# Patient Record
Sex: Female | Born: 2000 | Race: White | Hispanic: No | Marital: Single
Health system: Southern US, Academic
[De-identification: ages and names within clinical notes are randomized; demographics above are authoritative.]

## PROBLEM LIST (undated history)

## (undated) DIAGNOSIS — R519 Headache, unspecified: Secondary | ICD-10-CM

## (undated) DIAGNOSIS — R51 Headache: Secondary | ICD-10-CM

## (undated) HISTORY — PX: TONSILLECTOMY: SUR1361

## (undated) HISTORY — DX: Headache, unspecified: R51.9

## (undated) HISTORY — DX: Headache: R51

## (undated) HISTORY — PX: ADENOIDECTOMY: SUR15

---
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
## 2021-04-28 ENCOUNTER — Encounter (INDEPENDENT_AMBULATORY_CARE_PROVIDER_SITE_OTHER): Payer: Self-pay

## 2022-07-12 ENCOUNTER — Telehealth: Payer: Self-pay | Admitting: Physician Assistant

## 2022-07-12 DIAGNOSIS — T7840XA Allergy, unspecified, initial encounter: Secondary | ICD-10-CM

## 2022-07-12 MED ORDER — TRIAMCINOLONE ACETONIDE 0.1 % EX CREA: 1.0000 | TOPICAL_CREAM | Freq: Two times a day (BID) | CUTANEOUS | 0 refills | Status: DC

## 2022-07-12 MED ORDER — PREDNISONE 10 MG (21) PO TBPK: ORAL_TABLET | ORAL | 0 refills | Status: DC

## 2022-07-12 NOTE — Progress Notes (Signed)
Virtual Visit Consent   Kathryn Wheeler, you are scheduled for a virtual visit with a [facility] provider today. Just as with appointments in the office, your consent must be obtained to participate. Your consent will be active for this visit and any virtual visit you may have with one of our providers in the next 365 days. If you have a MyChart account, a copy of this consent can be sent to you electronically.  As this is a virtual visit, video technology does not allow for your provider to perform a traditional examination. This may limit your provider's ability to fully assess your condition. If your provider identifies any concerns that need to be evaluated in person or the need to arrange testing (such as labs, EKG, etc.), we will make arrangements to do so. Although advances in technology are sophisticated, we cannot ensure that it will always work on either your end or our end. If the connection with a video visit is poor, the visit may have to be switched to a telephone visit. With either a video or telephone visit, we are not always able to ensure that we have a secure connection.  By engaging in this virtual visit, you consent to the provision of healthcare and authorize for your insurance to be billed (if applicable) for the services provided during this visit. Depending on your insurance coverage, you may receive a charge related to this service.  I need to obtain your verbal consent now. Are you willing to proceed with your visit today? Druanne Bosques has provided verbal consent on 07/12/2022 for a virtual visit (video or telephone). Kathryn Wheeler, [place]  Date: 07/12/2022 3:13 PM  Virtual Visit via Video Note   I, Kathryn Wheeler, connected with  Philippa Vessey  ([id number], [birth year]) on 07/12/22 at  3:00 PM EDT by a video-enabled telemedicine application and verified that I am speaking with the correct person using two identifiers.  Location: Patient: Virtual Visit Location  Patient: Home Provider: Virtual Visit Location Provider: Home Office   I discussed the limitations of evaluation and management by telemedicine and the availability of in person appointments. The patient expressed understanding and agreed to proceed.    History of Present Illness: Kathryn Wheeler is a 21 y.o. who identifies as a female who was assigned female at birth, and is being seen today for possible allergic reaction. Patient endorses noting a rash of her neck, bilateral but L > R, about a week ago. Rash is itchy but not painful. Notes heat makes the area worse. Is now spreading up to include her face as well. Denies mouth, lip or tongue swelling. Denies fever, chills or malaise. Denies recent travel or sick contact. Denies rash elsewhere. Has been using some new facial cleansers recently but no other change to soaps, lotions, detergents or other hygiene products. Has used Benadryl OTC with some mild relief.   HPI: HPI  Problems:  Patient Active Problem List   Diagnosis Date Noted   Abnormal weight gain 03/05/2019   Insulin resistance 03/05/2019   Acanthosis 03/05/2019   Alopecia 02/26/2019   Obesity 02/26/2019   Migraine without aura and without status migrainosus, not intractable 11/20/2018   Episodic tension-type headache, not intractable 11/20/2018    Allergies: No Known Allergies Medications:  Current Outpatient Medications:    predniSONE (STERAPRED UNI-PAK 21 TAB) 10 MG (21) TBPK tablet, Take following package directions., Disp: 21 tablet, Rfl: 0   triamcinolone cream (KENALOG) 0.1 %, Apply 1 Application topically 2 (two)  times daily., Disp: 30 g, Rfl: 0  Observations/Objective: Patient is well-developed, well-nourished in no acute distress.  Resting comfortably at home.  Head is normocephalic, atraumatic.  No labored breathing. Speech is clear and coherent with logical content.  Patient is alert and oriented at baseline.  Erythematous rash of neck and L side of face with  areas of confluence. Rash   Assessment and Plan: 1. Allergic reaction, initial encounter - triamcinolone cream (KENALOG) 0.1 %; Apply 1 Application topically 2 (two) times daily.  Dispense: 30 g; Refill: 0 - predniSONE (STERAPRED UNI-PAK 21 TAB) 10 MG (21) TBPK tablet; Take following package directions.  Dispense: 21 tablet; Refill: 0  Supportive measures and OTC medications reviewed. Unknown trigger although suspect her new face wash so will have her stop use of this product and also keep a symptom journal. Start triamcinolone on the neck. Giving spread to face, will add on a steroid taper pack. Follow-up in person if not resolving.   Follow Up Instructions: I discussed the assessment and treatment plan with the patient. The patient was provided an opportunity to ask questions and all were answered. The patient agreed with the plan and demonstrated an understanding of the instructions.  A copy of instructions were sent to the patient via MyChart unless otherwise noted below.   The patient was advised to call back or seek an in-person evaluation if the symptoms worsen or if the condition fails to improve as anticipated.  Time:  I spent 10 minutes with the patient via telehealth technology discussing the above problems/concerns.    Kathryn Climes, PA-C

## 2022-07-12 NOTE — Patient Instructions (Signed)
  Sol Blazing, thank you for joining Piedad Climes, PA-C for today's virtual visit.  While this provider is not your primary care provider (PCP), if your PCP is located in our provider database this encounter information will be shared with them immediately following your visit.  Consent: (Patient) Kathryn Wheeler provided verbal consent for this virtual visit at the beginning of the encounter.  Current Medications:  Current Outpatient Medications:    MIGRELIEF 200-180-50 MG TABS, Take 2 tablets daily (Patient not taking: Reported on 03/05/2019), Disp: , Rfl:    rizatriptan (MAXALT-MLT) 10 MG disintegrating tablet, Take 1 tablet with 400 mg of ibuprofen at onset of migraine, may repeat an additional tablet in 2 hours if needed (Patient not taking: Reported on 03/05/2019), Disp: 9 tablet, Rfl: 5   Medications ordered in this encounter:  No orders of the defined types were placed in this encounter.    *If you need refills on other medications prior to your next appointment, please contact your pharmacy*  Follow-Up: Call back or seek an in-person evaluation if the symptoms worsen or if the condition fails to improve as anticipated.  Other Instructions Please keep the skin clean and dry. Cold compresses may be beneficial. Ok to continue use of the Benadryl. Apply the triamcinolone cream to areas on the neck. Take the steroid pack as directed.  If symptoms are not fully resolving or if you note any new/worsening symptoms despite treatment, please seek an in-person evaluation.    If you have been instructed to have an in-person evaluation today at a local Urgent Care facility, please use the link below. It will take you to a list of all of our available [facility], including address, phone number and hours of operation. Please do not delay care.  [facility]  If you or a family member do not have a primary care provider, use the link below to schedule a  visit and establish care. When you choose a [facility] primary care physician or advanced practice provider, you gain a long-term partner in health. Find a Primary Care Provider  Learn more about [facility]'s in-office and virtual care options: [facility] - Get Care Now

## 2022-07-14 ENCOUNTER — Ambulatory Visit: Payer: Self-pay | Admitting: *Deleted

## 2022-07-14 NOTE — Telephone Encounter (Signed)
  Chief Complaint: rash not getting better after tele health visit on 07/12/2022.   Now around her eyes Symptoms: rash now around her eyes Frequency: Rash around eyes new but rash on neck still there even with steroids Pertinent Negatives: Patient denies N/A Disposition: [] ED /[x] Urgent Care (no appt availability in office) / [] Appointment(In office/virtual)/ []  [facility]/ [] Home Care/ [] Refused Recommended Disposition /[] [facility]/ []  Follow-up with PCP Additional Notes: Offered another tele health visit or urgent care.   Mother decided to go to the urgent care.

## 2022-07-14 NOTE — Telephone Encounter (Signed)
Pt's mother called in but either she hung up or the line disconnected before we were connected. I called her back at [phone].   Reason for Disposition  Localized rash present > 7 days    Going to urgent care  Answer Assessment - Initial Assessment Questions 1. APPEARANCE of RASH: "Describe the rash."      Has red rash around her eyes.   Did tele health visit on Wed.   Medicine that they gave her not helping including the steroids.  The rash seems to be getting worse.   It's still on her neck too. Mother Kathryn Wheeler answered questions but [name] was there with her. 2. LOCATION: "Where is the rash located?"      Now around her eyes and still on her neck. 3. NUMBER: "How many spots are there?"      Not asked 4. SIZE: "How big are the spots?" (Inches, centimeters or compare to size of a coin)      Red rash 5. ONSET: "When did the rash start?"      A week ago 6. ITCHING: "Does the rash itch?" If Yes, ask: "How bad is the itch?"  (Scale 0-10; or none, mild, moderate, severe)     Not asked since been seen for it already but it's not improving with the medications. 7. PAIN: "Does the rash hurt?" If Yes, ask: "How bad is the pain?"  (Scale 0-10; or none, mild, moderate, severe)    - NONE (0): no pain    - MILD (1-3): doesn't interfere with normal activities     - MODERATE (4-7): interferes with normal activities or awakens from sleep     - SEVERE (8-10): excruciating pain, unable to do any normal activities     Not asked 8. OTHER SYMPTOMS: "Do you have any other symptoms?" (e.g., fever)     Not asked 9. PREGNANCY: "Is there any chance you are pregnant?" "When was your last menstrual period?"     Not asked  Protocols used: Rash or Redness - Localized-A-AH

## 2023-11-07 ENCOUNTER — Ambulatory Visit
Admission: EM | Admit: 2023-11-07 | Discharge: 2023-11-07 | Disposition: A | Discharge status: Home / Self Care | Payer: Self-pay | Attending: Internal Medicine | Admitting: Internal Medicine

## 2023-11-07 ENCOUNTER — Ambulatory Visit: Payer: Self-pay

## 2023-11-07 DIAGNOSIS — Z9089 Acquired absence of other organs: Secondary | ICD-10-CM | POA: Insufficient documentation

## 2023-11-07 DIAGNOSIS — Z23 Encounter for immunization: Secondary | ICD-10-CM | POA: Insufficient documentation

## 2023-11-07 DIAGNOSIS — L659 Nonscarring hair loss, unspecified: Secondary | ICD-10-CM | POA: Insufficient documentation

## 2023-11-07 DIAGNOSIS — M79644 Pain in right finger(s): Secondary | ICD-10-CM

## 2023-11-07 DIAGNOSIS — W57XXXA Bitten or stung by nonvenomous insect and other nonvenomous arthropods, initial encounter: Secondary | ICD-10-CM | POA: Insufficient documentation

## 2023-11-07 DIAGNOSIS — Z0000 Encounter for general adult medical examination without abnormal findings: Secondary | ICD-10-CM | POA: Insufficient documentation

## 2023-11-07 DIAGNOSIS — J029 Acute pharyngitis, unspecified: Secondary | ICD-10-CM | POA: Insufficient documentation

## 2023-11-07 DIAGNOSIS — L309 Dermatitis, unspecified: Secondary | ICD-10-CM | POA: Insufficient documentation

## 2023-11-07 DIAGNOSIS — J039 Acute tonsillitis, unspecified: Secondary | ICD-10-CM | POA: Insufficient documentation

## 2023-11-07 DIAGNOSIS — S60021A Contusion of right index finger without damage to nail, initial encounter: Secondary | ICD-10-CM

## 2023-11-07 DIAGNOSIS — R300 Dysuria: Secondary | ICD-10-CM | POA: Insufficient documentation

## 2023-11-07 DIAGNOSIS — H66002 Acute suppurative otitis media without spontaneous rupture of ear drum, left ear: Secondary | ICD-10-CM | POA: Insufficient documentation

## 2023-11-07 DIAGNOSIS — J050 Acute obstructive laryngitis [croup]: Secondary | ICD-10-CM | POA: Insufficient documentation

## 2023-11-07 DIAGNOSIS — M940 Chondrocostal junction syndrome [Tietze]: Secondary | ICD-10-CM | POA: Insufficient documentation

## 2023-11-07 DIAGNOSIS — J301 Allergic rhinitis due to pollen: Secondary | ICD-10-CM | POA: Insufficient documentation

## 2023-11-07 DIAGNOSIS — H6690 Otitis media, unspecified, unspecified ear: Secondary | ICD-10-CM | POA: Insufficient documentation

## 2023-11-07 DIAGNOSIS — L259 Unspecified contact dermatitis, unspecified cause: Secondary | ICD-10-CM | POA: Insufficient documentation

## 2023-11-07 DIAGNOSIS — Z4802 Encounter for removal of sutures: Secondary | ICD-10-CM | POA: Insufficient documentation

## 2023-11-07 DIAGNOSIS — S39011A Strain of muscle, fascia and tendon of abdomen, initial encounter: Secondary | ICD-10-CM | POA: Insufficient documentation

## 2023-11-07 DIAGNOSIS — Z719 Counseling, unspecified: Secondary | ICD-10-CM | POA: Insufficient documentation

## 2023-11-07 DIAGNOSIS — L509 Urticaria, unspecified: Secondary | ICD-10-CM | POA: Insufficient documentation

## 2023-11-07 DIAGNOSIS — R5381 Other malaise: Secondary | ICD-10-CM | POA: Insufficient documentation

## 2023-11-07 DIAGNOSIS — H109 Unspecified conjunctivitis: Secondary | ICD-10-CM | POA: Insufficient documentation

## 2023-11-07 DIAGNOSIS — Z00129 Encounter for routine child health examination without abnormal findings: Secondary | ICD-10-CM | POA: Insufficient documentation

## 2023-11-07 DIAGNOSIS — L0390 Cellulitis, unspecified: Secondary | ICD-10-CM | POA: Insufficient documentation

## 2023-11-07 DIAGNOSIS — Z68.41 Body mass index (BMI) pediatric, 85th percentile to less than 95th percentile for age: Secondary | ICD-10-CM | POA: Insufficient documentation

## 2023-11-07 DIAGNOSIS — L0100 Impetigo, unspecified: Secondary | ICD-10-CM | POA: Insufficient documentation

## 2023-11-07 DIAGNOSIS — R519 Headache, unspecified: Secondary | ICD-10-CM | POA: Insufficient documentation

## 2023-11-07 DIAGNOSIS — J309 Allergic rhinitis, unspecified: Secondary | ICD-10-CM | POA: Insufficient documentation

## 2023-11-07 DIAGNOSIS — R062 Wheezing: Secondary | ICD-10-CM | POA: Insufficient documentation

## 2023-11-07 DIAGNOSIS — J020 Streptococcal pharyngitis: Secondary | ICD-10-CM | POA: Insufficient documentation

## 2023-11-07 DIAGNOSIS — H60399 Other infective otitis externa, unspecified ear: Secondary | ICD-10-CM | POA: Insufficient documentation

## 2023-11-07 DIAGNOSIS — K5900 Constipation, unspecified: Secondary | ICD-10-CM | POA: Insufficient documentation

## 2023-11-07 DIAGNOSIS — J069 Acute upper respiratory infection, unspecified: Secondary | ICD-10-CM | POA: Insufficient documentation

## 2023-11-07 NOTE — ED Triage Notes (Signed)
"  I think I may have fractured my right index finger". "I was playing pickle ball and the paddle wacked me". Pain present, some swelling, decreased rom, "this happened Monday 11-05-2023".

## 2023-11-07 NOTE — Discharge Instructions (Signed)
Keep finger splinted.  Follow up with the Orthopaedist if pain persist past one week  Your final xray report is pending.  If anything is abnormal.  We will call you

## 2023-11-07 NOTE — ED Provider Notes (Signed)
[facility]    CSN: [id number] Arrival date & time: 11/07/23  1402      History   Chief Complaint Chief Complaint  Patient presents with   Finger Injury    HPI Kathryn Wheeler is a 22 y.o. female.   Patient reports that she injured her right index finger while playing pickle ball.  Patient reports finger was hit with a racquet.  Patient reports that she injured the finger on Monday 11/11.   The history is provided by the patient. No language interpreter was used.    Past Medical History:  Diagnosis Date   Headache     Patient Active Problem List   Diagnosis Date Noted   BMI 85th to less than 95th percentile with athletic build, pediatric 11/07/2023   Viral upper respiratory tract infection 11/07/2023   Acute suppurative otitis media of left ear without spontaneous rupture of tympanic membrane 11/07/2023   Allergic rhinitis 11/07/2023   Costochondritis 11/07/2023   Contact dermatitis of female genitalia 11/07/2023   Routine check-up 11/07/2023   Strain of abdominal muscle 11/07/2023   Acute nonintractable headache 11/07/2023   Cellulitis 11/07/2023   Conjunctivitis 11/07/2023   Constipation 11/07/2023   Counseled by nurse 11/07/2023   Dysuria 11/07/2023   Eczema 11/07/2023   Encounter for removal of sutures 11/07/2023   Hair loss 11/07/2023   Healthy child on routine physical examination 11/07/2023   History of adenoidectomy 11/07/2023   Impetigo 11/07/2023   Infective otitis externa 11/07/2023   Influenza vaccine administered 11/07/2023   Insect bites 11/07/2023   Malaise and fatigue 11/07/2023   Need for immunization against influenza 11/07/2023   Seasonal allergic rhinitis due to pollen 11/07/2023   Urticaria 11/07/2023   Well child check 11/07/2023   Wheezing 11/07/2023   Dermatitis, contact 11/07/2023   Acute pharyngitis 11/07/2023   Acute tonsillitis 11/07/2023   Acute URI 11/07/2023   Croup in pediatric patient 11/07/2023   Otitis  media, acute 11/07/2023   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      Home Medications    Prior to Admission medications   Medication Sig Start Date End Date Taking? Authorizing Provider  acetaminophen (TYLENOL) 500 MG tablet Take 500 mg by mouth every 6 (six) hours as needed for moderate pain (pain score 4-6), fever or headache. Last dose: 9am today.   Yes [provider]  predniSONE (STERAPRED UNI-PAK 21 TAB) 10 MG (21) TBPK tablet Take following package directions. 07/12/22   Waldon Merl, PA-C  triamcinolone cream (KENALOG) 0.1 % Apply 1 Application topically 2 (two) times daily. 07/12/22   Waldon Merl, PA-C    Family History Family History  Problem Relation Age of Onset   Obesity Mother     Social History Social History   Tobacco Use   Smoking status: Never   Smokeless tobacco: Never  Vaping Use   Vaping status: Never Used  Substance Use Topics   Alcohol use: No   Drug use: No  Allergies   Patient has no known allergies.   Review of Systems Review of Systems  Musculoskeletal:  Positive for joint swelling.  All other systems reviewed and are negative.    Physical Exam Triage Vital Signs ED Triage Vitals  Encounter Vitals Group     BP 11/07/23 1454 114/79     Systolic BP Percentile --      Diastolic BP Percentile --      Pulse Rate 11/07/23 1454 81     Resp 11/07/23 1454 18     Temp 11/07/23 1454 98 F (36.7 C)     Temp Source 11/07/23 1454 Oral     SpO2  11/07/23 1454 99 %     Weight 11/07/23 1452 200 lb (90.7 kg)     Height 11/07/23 1452 5\' 3"  (1.6 m)     Head Circumference --      Peak Flow --      Pain Score 11/07/23 1449 6     Pain Loc --      Pain Education --      Exclude from Growth Chart --    No data found.  Updated Vital Signs BP 114/79 (BP Location: Left Arm)   Pulse 81   Temp 98 F (36.7 C) (Oral)   Resp 18   Ht 5\' 3"  (1.6 m)   Wt 90.7 kg   LMP 10/26/2023 (Exact Date)   SpO2 99%   BMI 35.43 kg/m   Visual Acuity Right Eye Distance:   Left Eye Distance:   Bilateral Distance:    Right Eye Near:   Left Eye Near:    Bilateral Near:     Physical Exam Vitals reviewed.  Constitutional:      Appearance: Normal appearance.  Musculoskeletal:        General: Swelling and tenderness present.     Comments: Swollen PIP right index finger superficial abrasion full range of motion neurovascular neurosensory are intact.  Skin:    General: Skin is warm.  Neurological:     General: No focal deficit present.     Mental Status: She is alert.      UC Treatments / Results  Labs (all labs ordered are listed, but only abnormal results are displayed) Labs Reviewed - No data to display  EKG   Radiology No results found.  Procedures Procedures (including critical care time)  Medications Ordered in UC Medications - No data to display  Initial Impression / Assessment and Plan / UC Course  I have reviewed the triage vital signs and the nursing notes.  Pertinent labs & imaging results that were available during my care of the patient were reviewed by me and considered in my medical decision making (see chart for details).     Patient counseled on finger injury.  Patient is advised to follow-up with Dr. Orlan Leavens orthopedist if problems persist. Final Clinical Impressions(s) / UC Diagnoses   Final diagnoses:  Contusion of right index finger without damage to nail, initial encounter     Discharge Instructions       Keep finger splinted.  Follow up with the Orthopaedist if pain persist past one week  Your final xray report is pending.  If anything is abnormal.  We will call you    ED Prescriptions   None    PDMP not reviewed this encounter. An After Visit Summary was printed and given to the patient.       Elson Areas, [place] 11/07/23 1622

## 2024-12-15 ENCOUNTER — Encounter: Payer: Self-pay | Admitting: Cardiovascular Disease

## 2024-12-15 ENCOUNTER — Ambulatory Visit (INDEPENDENT_AMBULATORY_CARE_PROVIDER_SITE_OTHER): Admitting: Cardiovascular Disease

## 2024-12-15 VITALS — BP 122/86 | HR 97 | Ht 63.0 in | Wt 224.2 lb

## 2024-12-15 DIAGNOSIS — R0602 Shortness of breath: Secondary | ICD-10-CM

## 2024-12-15 DIAGNOSIS — Z0130 Encounter for examination of blood pressure without abnormal findings: Secondary | ICD-10-CM

## 2024-12-15 DIAGNOSIS — R0789 Other chest pain: Secondary | ICD-10-CM | POA: Diagnosis not present

## 2024-12-15 DIAGNOSIS — R9431 Abnormal electrocardiogram [ECG] [EKG]: Secondary | ICD-10-CM | POA: Diagnosis not present

## 2024-12-15 DIAGNOSIS — R002 Palpitations: Secondary | ICD-10-CM

## 2024-12-15 DIAGNOSIS — R42 Dizziness and giddiness: Secondary | ICD-10-CM | POA: Diagnosis not present

## 2024-12-15 NOTE — Progress Notes (Signed)
 "     Cardiology Office Note   Date:  12/15/2024   ID:  Ericah Scotto, DOB Nov 30, 2001, MRN [id number]  PCP:  Nicholaus Elsie NOVAK, MD  Cardiologist:  Denyse Bathe, MD      History of Present Illness: Kathryn Wheeler is a 23 y.o. female who presents for  Chief Complaint  Patient presents with   Acute Visit    High BP and HR, left arm tingling. Symptoms started a week ago tingling in left arm started 3 days ago. Earlier this morning bp was 154/105.     23yowf, presents with palpitation, and left arm Pain.Had episode today with fluttering, left arm pain and SOB. Had coffee in AM, but drinks every day.      Past Medical History:  Diagnosis Date   Headache      Past Surgical History:  Procedure Laterality Date   ADENOIDECTOMY     TONSILLECTOMY       Current Outpatient Medications  Medication Sig Dispense Refill   acetaminophen (TYLENOL) 500 MG tablet Take 500 mg by mouth every 6 (six) hours as needed for moderate pain (pain score 4-6), fever or headache. Last dose: 9am today.     predniSONE  (STERAPRED UNI-PAK 21 TAB) 10 MG (21) TBPK tablet Take following package directions. (Patient not taking: Reported on 12/15/2024) 21 tablet 0   triamcinolone  cream (KENALOG ) 0.1 % Apply 1 Application topically 2 (two) times daily. (Patient not taking: Reported on 12/15/2024) 30 g 0   No current facility-administered medications for this visit.    Allergies:   Patient has no known allergies.    Social History:   reports that she has never smoked. She has never used smokeless tobacco. She reports that she does not drink alcohol and does not use drugs.   Family History:  family history includes Obesity in her mother.    ROS:     Review of Systems  Constitutional: Negative.   HENT: Negative.    Eyes: Negative.   Respiratory: Negative.    Gastrointestinal: Negative.   Genitourinary: Negative.   Musculoskeletal: Negative.   Skin: Negative.   Neurological: Negative.    Endo/Heme/Allergies: Negative.   Psychiatric/Behavioral: Negative.    All other systems reviewed and are negative.     All other systems are reviewed and negative.    PHYSICAL EXAM: VS:  BP 122/86   Pulse 97   Ht 5' 3 (1.6 m)   Wt 224 lb 3.2 oz (101.7 kg)   SpO2 98%   BMI 39.72 kg/m  , BMI Body mass index is 39.72 kg/m. Last weight:  Wt Readings from Last 3 Encounters:  12/15/24 224 lb 3.2 oz (101.7 kg)  11/07/23 200 lb (90.7 kg)  03/05/19 207 lb 9.6 oz (94.2 kg) (98%, Z= 2.11)*   * Growth percentiles are based on CDC (Girls, 2-20 Years) data.     Physical Exam Constitutional:      Appearance: Normal appearance.  Cardiovascular:     Rate and Rhythm: Normal rate and regular rhythm.     Heart sounds: Normal heart sounds.  Pulmonary:     Effort: Pulmonary effort is normal.     Breath sounds: Normal breath sounds.  Musculoskeletal:     Right lower leg: No edema.     Left lower leg: No edema.  Neurological:     Mental Status: She is alert.       EKG: NSR 97/min LAE, no acute changes  Recent Labs: No results found for requested  labs within last 365 days.    Lipid Panel    Component Value Date/Time   CHOL 146 03/05/2019 0000   TRIG 80 03/05/2019 0000   HDL 54 03/05/2019 0000   CHOLHDL 2.7 03/05/2019 0000   LDLCALC 76 03/05/2019 0000      Other studies Reviewed: Additional studies/ records that were reviewed today include:  Review of the above records demonstrates:       No data to display            ASSESSMENT AND PLAN:    ICD-10-CM   1. Palpitation  R00.2 Comprehensive metabolic panel    CBC with Differential/Platelet    Magnesium    PCV ECHOCARDIOGRAM COMPLETE    CARDIAC EVENT MONITOR   Advise ECHO, and holter. Check LABS.    2. Other chest pain  R07.89 Comprehensive metabolic panel    CBC with Differential/Platelet    Magnesium    PCV ECHOCARDIOGRAM COMPLETE    CARDIAC EVENT MONITOR   Atypical, unlikely due to CAD.    3. SOB  (shortness of breath)  R06.02 Comprehensive metabolic panel    CBC with Differential/Platelet    Magnesium    PCV ECHOCARDIOGRAM COMPLETE    CARDIAC EVENT MONITOR    4. Dizziness  R42 Comprehensive metabolic panel    CBC with Differential/Platelet    Magnesium    PCV ECHOCARDIOGRAM COMPLETE    CARDIAC EVENT MONITOR    5. Abnormal EKG  R94.31 Comprehensive metabolic panel    CBC with Differential/Platelet    Magnesium    PCV ECHOCARDIOGRAM COMPLETE    CARDIAC EVENT MONITOR       Problem List Items Addressed This Visit   None Visit Diagnoses       Palpitation    -  Primary   Advise ECHO, and holter. Check LABS.   Relevant Orders   Comprehensive metabolic panel   CBC with Differential/Platelet   Magnesium   PCV ECHOCARDIOGRAM COMPLETE   CARDIAC EVENT MONITOR     Other chest pain       Atypical, unlikely due to CAD.   Relevant Orders   Comprehensive metabolic panel   CBC with Differential/Platelet   Magnesium   PCV ECHOCARDIOGRAM COMPLETE   CARDIAC EVENT MONITOR     SOB (shortness of breath)       Relevant Orders   Comprehensive metabolic panel   CBC with Differential/Platelet   Magnesium   PCV ECHOCARDIOGRAM COMPLETE   CARDIAC EVENT MONITOR     Dizziness       Relevant Orders   Comprehensive metabolic panel   CBC with Differential/Platelet   Magnesium   PCV ECHOCARDIOGRAM COMPLETE   CARDIAC EVENT MONITOR     Abnormal EKG       Relevant Orders   Comprehensive metabolic panel   CBC with Differential/Platelet   Magnesium   PCV ECHOCARDIOGRAM COMPLETE   CARDIAC EVENT MONITOR          Disposition:   Return in about 5 weeks (around 01/19/2025) for echo, holter and f/u.    Total time spent: 50 minutes  Signed,  Denyse Bathe, MD  12/15/2024 1:42 PM    [facility] "

## 2024-12-16 ENCOUNTER — Encounter

## 2024-12-16 DIAGNOSIS — R0602 Shortness of breath: Secondary | ICD-10-CM

## 2024-12-16 DIAGNOSIS — R0789 Other chest pain: Secondary | ICD-10-CM

## 2024-12-16 DIAGNOSIS — R002 Palpitations: Secondary | ICD-10-CM

## 2024-12-16 DIAGNOSIS — R9431 Abnormal electrocardiogram [ECG] [EKG]: Secondary | ICD-10-CM

## 2024-12-16 DIAGNOSIS — R42 Dizziness and giddiness: Secondary | ICD-10-CM

## 2024-12-17 LAB — COMPREHENSIVE METABOLIC PANEL WITH GFR
ALT: 15 IU/L (ref 0–32)
AST: 19 IU/L (ref 0–40)
Albumin: 4.5 g/dL (ref 4.0–5.0)
Alkaline Phosphatase: 74 IU/L (ref 41–116)
BUN/Creatinine Ratio: 13 (ref 9–23)
BUN: 11 mg/dL (ref 6–20)
Bilirubin Total: 0.3 mg/dL (ref 0.0–1.2)
CO2: 20 mmol/L (ref 20–29)
Calcium: 9.5 mg/dL (ref 8.7–10.2)
Chloride: 103 mmol/L (ref 96–106)
Creatinine, Ser: 0.84 mg/dL (ref 0.57–1.00)
Globulin, Total: 2.6 g/dL (ref 1.5–4.5)
Glucose: 84 mg/dL (ref 70–99)
Potassium: 4.3 mmol/L (ref 3.5–5.2)
Sodium: 138 mmol/L (ref 134–144)
Total Protein: 7.1 g/dL (ref 6.0–8.5)
eGFR: 101 mL/min/1.73

## 2024-12-17 LAB — CBC WITH DIFFERENTIAL/PLATELET
Basophils Absolute: 0 x10E3/uL (ref 0.0–0.2)
Basos: 1 %
EOS (ABSOLUTE): 0.1 x10E3/uL (ref 0.0–0.4)
Eos: 1 %
Hematocrit: 40.5 % (ref 34.0–46.6)
Hemoglobin: 13.3 g/dL (ref 11.1–15.9)
Immature Grans (Abs): 0 x10E3/uL (ref 0.0–0.1)
Immature Granulocytes: 0 %
Lymphocytes Absolute: 2.8 x10E3/uL (ref 0.7–3.1)
Lymphs: 31 %
MCH: 31.3 pg (ref 26.6–33.0)
MCHC: 32.8 g/dL (ref 31.5–35.7)
MCV: 95 fL (ref 79–97)
Monocytes Absolute: 0.6 x10E3/uL (ref 0.1–0.9)
Monocytes: 7 %
Neutrophils Absolute: 5.3 x10E3/uL (ref 1.4–7.0)
Neutrophils: 60 %
Platelets: 213 x10E3/uL (ref 150–450)
RBC: 4.25 x10E6/uL (ref 3.77–5.28)
RDW: 12.9 % (ref 11.7–15.4)
WBC: 8.9 x10E3/uL (ref 3.4–10.8)

## 2024-12-17 LAB — MAGNESIUM: Magnesium: 1.7 mg/dL (ref 1.6–2.3)

## 2024-12-30 ENCOUNTER — Ambulatory Visit: Admitting: Cardiovascular Disease

## 2024-12-30 ENCOUNTER — Encounter: Payer: Self-pay | Admitting: Cardiovascular Disease

## 2024-12-30 VITALS — BP 126/74 | HR 107 | Ht 63.0 in | Wt 228.8 lb

## 2024-12-30 DIAGNOSIS — R0789 Other chest pain: Secondary | ICD-10-CM

## 2024-12-30 DIAGNOSIS — R0602 Shortness of breath: Secondary | ICD-10-CM

## 2024-12-30 DIAGNOSIS — R42 Dizziness and giddiness: Secondary | ICD-10-CM

## 2024-12-30 DIAGNOSIS — R9431 Abnormal electrocardiogram [ECG] [EKG]: Secondary | ICD-10-CM | POA: Diagnosis not present

## 2024-12-30 DIAGNOSIS — R002 Palpitations: Secondary | ICD-10-CM | POA: Diagnosis not present

## 2024-12-30 MED ORDER — METOPROLOL SUCCINATE ER 25 MG PO TB24: 12.5000 mg | ORAL_TABLET | Freq: Every day | ORAL | 11 refills | Status: DC

## 2024-12-30 NOTE — Progress Notes (Signed)
 "     Cardiology Office Note   Date:  12/30/2024   ID:  [name], DOB [birth year], MRN [id number]  PCP:  Nicholaus Elsie NOVAK, MD  Cardiologist:  Denyse Bathe, MD      History of Present Illness: Kathryn Wheeler is a 24 y.o. female who presents for  Chief Complaint  Patient presents with   Follow-up    Holter follow up    Not pounding , no palpitation.      Past Medical History:  Diagnosis Date   Headache      Past Surgical History:  Procedure Laterality Date   ADENOIDECTOMY     TONSILLECTOMY       Current Outpatient Medications  Medication Sig Dispense Refill   acetaminophen (TYLENOL) 500 MG tablet Take 500 mg by mouth every 6 (six) hours as needed for moderate pain (pain score 4-6), fever or headache. Last dose: 9am today.     metoprolol  succinate (TOPROL  XL) 25 MG 24 hr tablet Take 0.5 tablets (12.5 mg total) by mouth daily. 15 tablet 11   No current facility-administered medications for this visit.    Allergies:   Patient has no known allergies.    Social History:   reports that she has never smoked. She has never used smokeless tobacco. She reports that she does not drink alcohol and does not use drugs.   Family History:  family history includes Obesity in her mother.    ROS:     Review of Systems  Constitutional: Negative.   HENT: Negative.    Eyes: Negative.   Respiratory: Negative.    Gastrointestinal: Negative.   Genitourinary: Negative.   Musculoskeletal: Negative.   Skin: Negative.   Neurological: Negative.   Endo/Heme/Allergies: Negative.   Psychiatric/Behavioral: Negative.    All other systems reviewed and are negative.     All other systems are reviewed and negative.    PHYSICAL EXAM: VS:  BP 126/74   Pulse (!) 107   Ht 5' 3 (1.6 m)   Wt 228 lb 12.8 oz (103.8 kg)   SpO2 99%   BMI 40.53 kg/m  , BMI Body mass index is 40.53 kg/m. Last weight:  Wt Readings from Last 3 Encounters:  12/30/24 228 lb 12.8 oz (103.8 kg)   12/15/24 224 lb 3.2 oz (101.7 kg)  11/07/23 200 lb (90.7 kg)     Physical Exam Constitutional:      Appearance: Normal appearance.  Cardiovascular:     Rate and Rhythm: Normal rate and regular rhythm.     Heart sounds: Normal heart sounds.  Pulmonary:     Effort: Pulmonary effort is normal.     Breath sounds: Normal breath sounds.  Musculoskeletal:     Right lower leg: No edema.     Left lower leg: No edema.  Neurological:     Mental Status: She is alert.       EKG:   Recent Labs: 12/16/2024: ALT 15; BUN 11; Creatinine, Ser 0.84; Hemoglobin 13.3; Magnesium 1.7; Platelets 213; Potassium 4.3; Sodium 138    Lipid Panel    Component Value Date/Time   CHOL 146 03/05/2019 0000   TRIG 80 03/05/2019 0000   HDL 54 03/05/2019 0000   CHOLHDL 2.7 03/05/2019 0000   LDLCALC 76 03/05/2019 0000      Other studies Reviewed: Additional studies/ records that were reviewed today include:  Review of the above records demonstrates:       No data to display  ASSESSMENT AND PLAN:    ICD-10-CM   1. Other chest pain  R07.89 metoprolol  succinate (TOPROL  XL) 25 MG 24 hr tablet    2. SOB (shortness of breath)  R06.02 metoprolol  succinate (TOPROL  XL) 25 MG 24 hr tablet   Still echo, pending, and holter came off today.    3. Palpitation  R00.2 metoprolol  succinate (TOPROL  XL) 25 MG 24 hr tablet    4. Dizziness  R42 metoprolol  succinate (TOPROL  XL) 25 MG 24 hr tablet    5. Abnormal EKG  R94.31 metoprolol  succinate (TOPROL  XL) 25 MG 24 hr tablet       Problem List Items Addressed This Visit   None Visit Diagnoses       Other chest pain    -  Primary   Relevant Medications   metoprolol  succinate (TOPROL  XL) 25 MG 24 hr tablet     SOB (shortness of breath)       Still echo, pending, and holter came off today.   Relevant Medications   metoprolol  succinate (TOPROL  XL) 25 MG 24 hr tablet     Palpitation       Relevant Medications   metoprolol  succinate (TOPROL   XL) 25 MG 24 hr tablet     Dizziness       Relevant Medications   metoprolol  succinate (TOPROL  XL) 25 MG 24 hr tablet     Abnormal EKG       Relevant Medications   metoprolol  succinate (TOPROL  XL) 25 MG 24 hr tablet          Disposition:   Return in about 4 weeks (around 01/27/2025) for see if echo can be done sooner and then f/u.    Total time spent: 30 minutes  Signed,  Denyse Bathe, MD  12/30/2024 2:08 PM    [facility] "

## 2025-01-09 ENCOUNTER — Ambulatory Visit

## 2025-01-09 ENCOUNTER — Other Ambulatory Visit

## 2025-01-09 ENCOUNTER — Other Ambulatory Visit: Payer: Self-pay | Admitting: Cardiovascular Disease

## 2025-01-09 DIAGNOSIS — I340 Nonrheumatic mitral (valve) insufficiency: Secondary | ICD-10-CM

## 2025-01-09 DIAGNOSIS — I361 Nonrheumatic tricuspid (valve) insufficiency: Secondary | ICD-10-CM

## 2025-01-09 DIAGNOSIS — R0602 Shortness of breath: Secondary | ICD-10-CM

## 2025-01-09 DIAGNOSIS — R002 Palpitations: Secondary | ICD-10-CM

## 2025-01-09 DIAGNOSIS — R0789 Other chest pain: Secondary | ICD-10-CM

## 2025-01-09 DIAGNOSIS — R42 Dizziness and giddiness: Secondary | ICD-10-CM

## 2025-01-09 DIAGNOSIS — R9431 Abnormal electrocardiogram [ECG] [EKG]: Secondary | ICD-10-CM

## 2025-01-09 DIAGNOSIS — I371 Nonrheumatic pulmonary valve insufficiency: Secondary | ICD-10-CM

## 2025-01-23 ENCOUNTER — Other Ambulatory Visit

## 2025-01-23 DIAGNOSIS — R42 Dizziness and giddiness: Secondary | ICD-10-CM

## 2025-01-23 DIAGNOSIS — R9431 Abnormal electrocardiogram [ECG] [EKG]: Secondary | ICD-10-CM

## 2025-01-23 DIAGNOSIS — R002 Palpitations: Secondary | ICD-10-CM

## 2025-01-23 DIAGNOSIS — R0789 Other chest pain: Secondary | ICD-10-CM

## 2025-01-23 DIAGNOSIS — R0602 Shortness of breath: Secondary | ICD-10-CM

## 2025-01-29 ENCOUNTER — Encounter: Payer: Self-pay | Admitting: Cardiovascular Disease

## 2025-01-29 ENCOUNTER — Ambulatory Visit (INDEPENDENT_AMBULATORY_CARE_PROVIDER_SITE_OTHER): Admitting: Cardiovascular Disease

## 2025-01-29 VITALS — BP 112/74 | HR 108 | Ht 63.0 in | Wt 225.0 lb

## 2025-01-29 DIAGNOSIS — R0602 Shortness of breath: Secondary | ICD-10-CM | POA: Diagnosis not present

## 2025-01-29 DIAGNOSIS — R0789 Other chest pain: Secondary | ICD-10-CM

## 2025-01-29 DIAGNOSIS — R002 Palpitations: Secondary | ICD-10-CM | POA: Diagnosis not present

## 2025-01-29 DIAGNOSIS — I4711 Inappropriate sinus tachycardia, so stated: Secondary | ICD-10-CM | POA: Diagnosis not present

## 2025-01-29 DIAGNOSIS — Z0130 Encounter for examination of blood pressure without abnormal findings: Secondary | ICD-10-CM

## 2025-01-29 DIAGNOSIS — R9431 Abnormal electrocardiogram [ECG] [EKG]: Secondary | ICD-10-CM

## 2025-01-29 DIAGNOSIS — R42 Dizziness and giddiness: Secondary | ICD-10-CM

## 2025-01-29 MED ORDER — METOPROLOL SUCCINATE ER 25 MG PO TB24: 25.0000 mg | ORAL_TABLET | Freq: Every day | ORAL | 11 refills | Status: AC

## 2025-01-29 NOTE — Progress Notes (Signed)
 "     Cardiology Office Note   Date:  01/29/2025   ID:  Kathryn Wheeler, DOB [birth year], MRN [id number]  PCP:  Nicholaus Elsie NOVAK, MD  Cardiologist:  Denyse Bathe, MD      History of Present Illness: Kathryn Wheeler is a 24 y.o. female who presents for  Chief Complaint  Patient presents with   Follow-up    Echo and holter follow up    No chest pain or palpitation.      Past Medical History:  Diagnosis Date   Headache      Past Surgical History:  Procedure Laterality Date   ADENOIDECTOMY     TONSILLECTOMY       Current Outpatient Medications  Medication Sig Dispense Refill   acetaminophen (TYLENOL) 500 MG tablet Take 500 mg by mouth every 6 (six) hours as needed for moderate pain (pain score 4-6), fever or headache. Last dose: 9am today.     metoprolol  succinate (TOPROL  XL) 25 MG 24 hr tablet Take 1 tablet (25 mg total) by mouth daily. 30 tablet 11   No current facility-administered medications for this visit.    Allergies:   Patient has no known allergies.    Social History:   reports that she has never smoked. She has never used smokeless tobacco. She reports that she does not drink alcohol and does not use drugs.   Family History:  family history includes Obesity in her mother.    ROS:     Review of Systems  Constitutional: Negative.   HENT: Negative.    Eyes: Negative.   Respiratory: Negative.    Gastrointestinal: Negative.   Genitourinary: Negative.   Musculoskeletal: Negative.   Skin: Negative.   Neurological: Negative.   Endo/Heme/Allergies: Negative.   Psychiatric/Behavioral: Negative.    All other systems reviewed and are negative.     All other systems are reviewed and negative.    PHYSICAL EXAM: VS:  BP 112/74   Pulse (!) 108   Ht 5' 3 (1.6 m)   Wt 225 lb (102.1 kg)   SpO2 99%   BMI 39.86 kg/m  , BMI Body mass index is 39.86 kg/m. Last weight:  Wt Readings from Last 3 Encounters:  01/29/25 225 lb (102.1 kg)  12/30/24 228 lb 12.8  oz (103.8 kg)  12/15/24 224 lb 3.2 oz (101.7 kg)     Physical Exam Constitutional:      Appearance: Normal appearance.  Cardiovascular:     Rate and Rhythm: Normal rate and regular rhythm.     Heart sounds: Normal heart sounds.  Pulmonary:     Effort: Pulmonary effort is normal.     Breath sounds: Normal breath sounds.  Musculoskeletal:     Right lower leg: No edema.     Left lower leg: No edema.  Neurological:     Mental Status: She is alert.       EKG:   Recent Labs: 12/16/2024: ALT 15; BUN 11; Creatinine, Ser 0.84; Hemoglobin 13.3; Magnesium 1.7; Platelets 213; Potassium 4.3; Sodium 138    Lipid Panel    Component Value Date/Time   CHOL 146 03/05/2019 0000   TRIG 80 03/05/2019 0000   HDL 54 03/05/2019 0000   CHOLHDL 2.7 03/05/2019 0000   LDLCALC 76 03/05/2019 0000      Other studies Reviewed: Additional studies/ records that were reviewed today include:  Review of the above records demonstrates:       No data to display  ASSESSMENT AND PLAN:    ICD-10-CM   1. Inappropriate sinus tachycardia  I47.11    HR 157/ on monitor but sinus tachy, LVEF 52%, start metoprolol  25 mg daily    2. Palpitations  R00.2     3. Shortness of breath  R06.02     4. Other chest pain  R07.89 metoprolol  succinate (TOPROL  XL) 25 MG 24 hr tablet    5. Dizziness and giddiness  R42     6. Nonspecific abnormal electrocardiogram (ECG) (EKG)  R94.31     7. SOB (shortness of breath)  R06.02 metoprolol  succinate (TOPROL  XL) 25 MG 24 hr tablet    8. Palpitation  R00.2 metoprolol  succinate (TOPROL  XL) 25 MG 24 hr tablet    9. Dizziness  R42 metoprolol  succinate (TOPROL  XL) 25 MG 24 hr tablet    10. SOB (shortness of breath)  R06.02 metoprolol  succinate (TOPROL  XL) 25 MG 24 hr tablet   Still echo, pending, and holter came off today.    11. Abnormal EKG  R94.31 metoprolol  succinate (TOPROL  XL) 25 MG 24 hr tablet       Problem List Items Addressed This Visit    None Visit Diagnoses       Inappropriate sinus tachycardia    -  Primary   HR 157/ on monitor but sinus tachy, LVEF 52%, start metoprolol  25 mg daily   Relevant Medications   metoprolol  succinate (TOPROL  XL) 25 MG 24 hr tablet     Palpitations         Shortness of breath         Other chest pain       Relevant Medications   metoprolol  succinate (TOPROL  XL) 25 MG 24 hr tablet     Dizziness and giddiness         Nonspecific abnormal electrocardiogram (ECG) (EKG)         SOB (shortness of breath)       Relevant Medications   metoprolol  succinate (TOPROL  XL) 25 MG 24 hr tablet     Palpitation       Relevant Medications   metoprolol  succinate (TOPROL  XL) 25 MG 24 hr tablet     Dizziness       Relevant Medications   metoprolol  succinate (TOPROL  XL) 25 MG 24 hr tablet     SOB (shortness of breath)       Still echo, pending, and holter came off today.   Relevant Medications   metoprolol  succinate (TOPROL  XL) 25 MG 24 hr tablet     Abnormal EKG       Relevant Medications   metoprolol  succinate (TOPROL  XL) 25 MG 24 hr tablet          Disposition:   Return in about 4 weeks (around 02/26/2025).    Total time spent: 35 minutes  Signed,  Denyse Bathe, MD  01/29/2025 1:41 PM    [facility] "

## 2025-02-26 ENCOUNTER — Ambulatory Visit: Admitting: Cardiovascular Disease
